# Patient Record
Sex: Male | Born: 1950 | Race: White | Hispanic: No | Marital: Single | Smoking: Current every day smoker
Health system: Southern US, Community
[De-identification: ages and names within clinical notes are randomized; demographics above are authoritative.]

## PROBLEM LIST (undated history)

## (undated) DIAGNOSIS — E079 Disorder of thyroid, unspecified: Secondary | ICD-10-CM

---

## 2014-04-28 DIAGNOSIS — R911 Solitary pulmonary nodule: Secondary | ICD-10-CM | POA: Diagnosis not present

## 2014-04-28 DIAGNOSIS — R51 Headache: Secondary | ICD-10-CM | POA: Diagnosis not present

## 2014-04-28 DIAGNOSIS — F17210 Nicotine dependence, cigarettes, uncomplicated: Secondary | ICD-10-CM | POA: Diagnosis not present

## 2014-04-28 DIAGNOSIS — E039 Hypothyroidism, unspecified: Secondary | ICD-10-CM | POA: Diagnosis not present

## 2014-04-28 DIAGNOSIS — I10 Essential (primary) hypertension: Secondary | ICD-10-CM | POA: Diagnosis not present

## 2014-04-28 DIAGNOSIS — E780 Pure hypercholesterolemia: Secondary | ICD-10-CM | POA: Diagnosis not present

## 2014-04-28 DIAGNOSIS — J209 Acute bronchitis, unspecified: Secondary | ICD-10-CM | POA: Diagnosis not present

## 2014-04-29 DIAGNOSIS — R911 Solitary pulmonary nodule: Secondary | ICD-10-CM | POA: Diagnosis not present

## 2014-07-15 DIAGNOSIS — S51801A Unspecified open wound of right forearm, initial encounter: Secondary | ICD-10-CM | POA: Diagnosis not present

## 2014-07-19 DIAGNOSIS — L0390 Cellulitis, unspecified: Secondary | ICD-10-CM | POA: Diagnosis not present

## 2014-07-19 DIAGNOSIS — S51801D Unspecified open wound of right forearm, subsequent encounter: Secondary | ICD-10-CM | POA: Diagnosis not present

## 2014-07-20 DIAGNOSIS — B3749 Other urogenital candidiasis: Secondary | ICD-10-CM | POA: Diagnosis not present

## 2014-07-20 DIAGNOSIS — R369 Urethral discharge, unspecified: Secondary | ICD-10-CM | POA: Diagnosis not present

## 2014-07-20 DIAGNOSIS — B379 Candidiasis, unspecified: Secondary | ICD-10-CM | POA: Diagnosis not present

## 2014-07-24 DIAGNOSIS — J449 Chronic obstructive pulmonary disease, unspecified: Secondary | ICD-10-CM | POA: Diagnosis not present

## 2014-07-24 DIAGNOSIS — S30862A Insect bite (nonvenomous) of penis, initial encounter: Secondary | ICD-10-CM | POA: Diagnosis not present

## 2014-07-24 DIAGNOSIS — E780 Pure hypercholesterolemia: Secondary | ICD-10-CM | POA: Diagnosis not present

## 2014-07-24 DIAGNOSIS — B009 Herpesviral infection, unspecified: Secondary | ICD-10-CM | POA: Diagnosis not present

## 2014-07-24 DIAGNOSIS — R609 Edema, unspecified: Secondary | ICD-10-CM | POA: Diagnosis not present

## 2014-07-24 DIAGNOSIS — Z792 Long term (current) use of antibiotics: Secondary | ICD-10-CM | POA: Diagnosis not present

## 2014-07-24 DIAGNOSIS — F17210 Nicotine dependence, cigarettes, uncomplicated: Secondary | ICD-10-CM | POA: Diagnosis not present

## 2014-07-24 DIAGNOSIS — E039 Hypothyroidism, unspecified: Secondary | ICD-10-CM | POA: Diagnosis not present

## 2014-07-24 DIAGNOSIS — I10 Essential (primary) hypertension: Secondary | ICD-10-CM | POA: Diagnosis not present

## 2014-09-07 DIAGNOSIS — R45851 Suicidal ideations: Secondary | ICD-10-CM | POA: Diagnosis not present

## 2014-09-07 DIAGNOSIS — R55 Syncope and collapse: Secondary | ICD-10-CM | POA: Diagnosis not present

## 2014-09-07 DIAGNOSIS — R45850 Homicidal ideations: Secondary | ICD-10-CM | POA: Diagnosis not present

## 2014-09-07 DIAGNOSIS — F17210 Nicotine dependence, cigarettes, uncomplicated: Secondary | ICD-10-CM | POA: Diagnosis not present

## 2014-09-07 DIAGNOSIS — Z915 Personal history of self-harm: Secondary | ICD-10-CM | POA: Diagnosis not present

## 2014-09-07 DIAGNOSIS — F329 Major depressive disorder, single episode, unspecified: Secondary | ICD-10-CM | POA: Diagnosis not present

## 2014-09-07 DIAGNOSIS — R4182 Altered mental status, unspecified: Secondary | ICD-10-CM | POA: Diagnosis not present

## 2014-09-07 DIAGNOSIS — I10 Essential (primary) hypertension: Secondary | ICD-10-CM | POA: Diagnosis not present

## 2014-09-08 DIAGNOSIS — R55 Syncope and collapse: Secondary | ICD-10-CM | POA: Diagnosis not present

## 2014-09-08 DIAGNOSIS — R4182 Altered mental status, unspecified: Secondary | ICD-10-CM | POA: Diagnosis not present

## 2014-11-01 DIAGNOSIS — F17210 Nicotine dependence, cigarettes, uncomplicated: Secondary | ICD-10-CM | POA: Diagnosis not present

## 2014-11-01 DIAGNOSIS — R531 Weakness: Secondary | ICD-10-CM | POA: Diagnosis not present

## 2014-11-01 DIAGNOSIS — Z7952 Long term (current) use of systemic steroids: Secondary | ICD-10-CM | POA: Diagnosis not present

## 2014-11-01 DIAGNOSIS — J441 Chronic obstructive pulmonary disease with (acute) exacerbation: Secondary | ICD-10-CM | POA: Diagnosis not present

## 2014-11-01 DIAGNOSIS — R0600 Dyspnea, unspecified: Secondary | ICD-10-CM | POA: Diagnosis not present

## 2014-11-01 DIAGNOSIS — J45901 Unspecified asthma with (acute) exacerbation: Secondary | ICD-10-CM | POA: Diagnosis not present

## 2014-12-06 DIAGNOSIS — J441 Chronic obstructive pulmonary disease with (acute) exacerbation: Secondary | ICD-10-CM | POA: Diagnosis not present

## 2014-12-06 DIAGNOSIS — J209 Acute bronchitis, unspecified: Secondary | ICD-10-CM | POA: Diagnosis not present

## 2014-12-06 DIAGNOSIS — R079 Chest pain, unspecified: Secondary | ICD-10-CM | POA: Diagnosis not present

## 2014-12-16 DIAGNOSIS — J440 Chronic obstructive pulmonary disease with acute lower respiratory infection: Secondary | ICD-10-CM | POA: Diagnosis not present

## 2014-12-16 DIAGNOSIS — Z720 Tobacco use: Secondary | ICD-10-CM | POA: Diagnosis not present

## 2015-01-11 DIAGNOSIS — R531 Weakness: Secondary | ICD-10-CM | POA: Diagnosis not present

## 2015-01-11 DIAGNOSIS — R404 Transient alteration of awareness: Secondary | ICD-10-CM | POA: Diagnosis not present

## 2015-04-26 DIAGNOSIS — R05 Cough: Secondary | ICD-10-CM | POA: Diagnosis not present

## 2015-04-26 DIAGNOSIS — S2232XA Fracture of one rib, left side, initial encounter for closed fracture: Secondary | ICD-10-CM | POA: Diagnosis not present

## 2015-04-26 DIAGNOSIS — R531 Weakness: Secondary | ICD-10-CM | POA: Diagnosis not present

## 2015-11-18 DIAGNOSIS — K5900 Constipation, unspecified: Secondary | ICD-10-CM | POA: Diagnosis not present

## 2015-11-18 DIAGNOSIS — R1084 Generalized abdominal pain: Secondary | ICD-10-CM | POA: Diagnosis not present

## 2015-11-18 DIAGNOSIS — R188 Other ascites: Secondary | ICD-10-CM | POA: Diagnosis not present

## 2015-11-18 DIAGNOSIS — N390 Urinary tract infection, site not specified: Secondary | ICD-10-CM | POA: Diagnosis not present

## 2015-11-18 DIAGNOSIS — R319 Hematuria, unspecified: Secondary | ICD-10-CM | POA: Diagnosis not present

## 2015-11-18 DIAGNOSIS — K7031 Alcoholic cirrhosis of liver with ascites: Secondary | ICD-10-CM | POA: Diagnosis not present

## 2015-11-20 DIAGNOSIS — R109 Unspecified abdominal pain: Secondary | ICD-10-CM | POA: Diagnosis not present

## 2016-10-02 DIAGNOSIS — J441 Chronic obstructive pulmonary disease with (acute) exacerbation: Secondary | ICD-10-CM | POA: Diagnosis not present

## 2016-10-02 DIAGNOSIS — R109 Unspecified abdominal pain: Secondary | ICD-10-CM | POA: Diagnosis not present

## 2016-10-02 DIAGNOSIS — J9602 Acute respiratory failure with hypercapnia: Secondary | ICD-10-CM | POA: Diagnosis not present

## 2016-10-02 DIAGNOSIS — R0902 Hypoxemia: Secondary | ICD-10-CM | POA: Diagnosis not present

## 2016-10-02 DIAGNOSIS — R0602 Shortness of breath: Secondary | ICD-10-CM | POA: Diagnosis not present

## 2016-10-02 DIAGNOSIS — R531 Weakness: Secondary | ICD-10-CM | POA: Diagnosis not present

## 2016-10-02 DIAGNOSIS — R079 Chest pain, unspecified: Secondary | ICD-10-CM | POA: Diagnosis not present

## 2016-10-03 DIAGNOSIS — F1010 Alcohol abuse, uncomplicated: Secondary | ICD-10-CM

## 2016-10-03 DIAGNOSIS — I517 Cardiomegaly: Secondary | ICD-10-CM | POA: Diagnosis not present

## 2016-10-03 DIAGNOSIS — R531 Weakness: Secondary | ICD-10-CM

## 2016-10-03 DIAGNOSIS — J9601 Acute respiratory failure with hypoxia: Secondary | ICD-10-CM

## 2016-10-03 DIAGNOSIS — E669 Obesity, unspecified: Secondary | ICD-10-CM

## 2016-10-03 DIAGNOSIS — J441 Chronic obstructive pulmonary disease with (acute) exacerbation: Secondary | ICD-10-CM

## 2016-10-03 DIAGNOSIS — F329 Major depressive disorder, single episode, unspecified: Secondary | ICD-10-CM | POA: Diagnosis not present

## 2016-10-03 DIAGNOSIS — Z9119 Patient's noncompliance with other medical treatment and regimen: Secondary | ICD-10-CM

## 2016-10-03 DIAGNOSIS — I10 Essential (primary) hypertension: Secondary | ICD-10-CM | POA: Diagnosis not present

## 2016-10-04 DIAGNOSIS — J9601 Acute respiratory failure with hypoxia: Secondary | ICD-10-CM | POA: Diagnosis not present

## 2016-10-04 DIAGNOSIS — K920 Hematemesis: Secondary | ICD-10-CM | POA: Diagnosis not present

## 2016-10-04 DIAGNOSIS — I10 Essential (primary) hypertension: Secondary | ICD-10-CM | POA: Diagnosis not present

## 2016-10-04 DIAGNOSIS — E669 Obesity, unspecified: Secondary | ICD-10-CM | POA: Diagnosis not present

## 2016-10-04 DIAGNOSIS — Z9119 Patient's noncompliance with other medical treatment and regimen: Secondary | ICD-10-CM | POA: Diagnosis not present

## 2016-10-04 DIAGNOSIS — J441 Chronic obstructive pulmonary disease with (acute) exacerbation: Secondary | ICD-10-CM | POA: Diagnosis not present

## 2016-10-04 DIAGNOSIS — F329 Major depressive disorder, single episode, unspecified: Secondary | ICD-10-CM | POA: Diagnosis not present

## 2016-10-04 DIAGNOSIS — F1010 Alcohol abuse, uncomplicated: Secondary | ICD-10-CM | POA: Diagnosis not present

## 2016-10-04 DIAGNOSIS — R531 Weakness: Secondary | ICD-10-CM | POA: Diagnosis not present

## 2016-10-05 DIAGNOSIS — K7460 Unspecified cirrhosis of liver: Secondary | ICD-10-CM | POA: Diagnosis not present

## 2016-10-05 DIAGNOSIS — F1010 Alcohol abuse, uncomplicated: Secondary | ICD-10-CM | POA: Diagnosis not present

## 2016-10-05 DIAGNOSIS — R531 Weakness: Secondary | ICD-10-CM | POA: Diagnosis not present

## 2016-10-05 DIAGNOSIS — E669 Obesity, unspecified: Secondary | ICD-10-CM | POA: Diagnosis not present

## 2016-10-05 DIAGNOSIS — F329 Major depressive disorder, single episode, unspecified: Secondary | ICD-10-CM | POA: Diagnosis not present

## 2016-10-05 DIAGNOSIS — I10 Essential (primary) hypertension: Secondary | ICD-10-CM | POA: Diagnosis not present

## 2016-10-05 DIAGNOSIS — J441 Chronic obstructive pulmonary disease with (acute) exacerbation: Secondary | ICD-10-CM | POA: Diagnosis not present

## 2016-10-05 DIAGNOSIS — Z9119 Patient's noncompliance with other medical treatment and regimen: Secondary | ICD-10-CM | POA: Diagnosis not present

## 2016-10-05 DIAGNOSIS — J9601 Acute respiratory failure with hypoxia: Secondary | ICD-10-CM | POA: Diagnosis not present

## 2016-10-05 DIAGNOSIS — K922 Gastrointestinal hemorrhage, unspecified: Secondary | ICD-10-CM | POA: Diagnosis not present

## 2016-10-05 DIAGNOSIS — K920 Hematemesis: Secondary | ICD-10-CM | POA: Diagnosis not present

## 2016-10-06 DIAGNOSIS — Z9119 Patient's noncompliance with other medical treatment and regimen: Secondary | ICD-10-CM | POA: Diagnosis not present

## 2016-10-06 DIAGNOSIS — F1010 Alcohol abuse, uncomplicated: Secondary | ICD-10-CM | POA: Diagnosis not present

## 2016-10-06 DIAGNOSIS — E669 Obesity, unspecified: Secondary | ICD-10-CM | POA: Diagnosis not present

## 2016-10-06 DIAGNOSIS — J9601 Acute respiratory failure with hypoxia: Secondary | ICD-10-CM | POA: Diagnosis not present

## 2016-10-06 DIAGNOSIS — J441 Chronic obstructive pulmonary disease with (acute) exacerbation: Secondary | ICD-10-CM | POA: Diagnosis not present

## 2016-10-06 DIAGNOSIS — F329 Major depressive disorder, single episode, unspecified: Secondary | ICD-10-CM | POA: Diagnosis not present

## 2016-10-06 DIAGNOSIS — I10 Essential (primary) hypertension: Secondary | ICD-10-CM | POA: Diagnosis not present

## 2016-10-06 DIAGNOSIS — R531 Weakness: Secondary | ICD-10-CM | POA: Diagnosis not present

## 2016-10-06 DIAGNOSIS — K920 Hematemesis: Secondary | ICD-10-CM | POA: Diagnosis not present

## 2016-10-07 DIAGNOSIS — J441 Chronic obstructive pulmonary disease with (acute) exacerbation: Secondary | ICD-10-CM | POA: Diagnosis not present

## 2016-10-07 DIAGNOSIS — J9601 Acute respiratory failure with hypoxia: Secondary | ICD-10-CM | POA: Diagnosis not present

## 2016-10-07 DIAGNOSIS — I10 Essential (primary) hypertension: Secondary | ICD-10-CM | POA: Diagnosis not present

## 2016-10-07 DIAGNOSIS — K920 Hematemesis: Secondary | ICD-10-CM | POA: Diagnosis not present

## 2016-10-07 DIAGNOSIS — R531 Weakness: Secondary | ICD-10-CM | POA: Diagnosis not present

## 2016-10-07 DIAGNOSIS — F1010 Alcohol abuse, uncomplicated: Secondary | ICD-10-CM | POA: Diagnosis not present

## 2016-10-07 DIAGNOSIS — F329 Major depressive disorder, single episode, unspecified: Secondary | ICD-10-CM | POA: Diagnosis not present

## 2016-10-07 DIAGNOSIS — Z9119 Patient's noncompliance with other medical treatment and regimen: Secondary | ICD-10-CM | POA: Diagnosis not present

## 2016-10-07 DIAGNOSIS — E669 Obesity, unspecified: Secondary | ICD-10-CM | POA: Diagnosis not present

## 2016-10-08 DIAGNOSIS — I10 Essential (primary) hypertension: Secondary | ICD-10-CM | POA: Diagnosis not present

## 2016-10-08 DIAGNOSIS — F329 Major depressive disorder, single episode, unspecified: Secondary | ICD-10-CM | POA: Diagnosis not present

## 2016-10-08 DIAGNOSIS — F1010 Alcohol abuse, uncomplicated: Secondary | ICD-10-CM | POA: Diagnosis not present

## 2016-10-08 DIAGNOSIS — J441 Chronic obstructive pulmonary disease with (acute) exacerbation: Secondary | ICD-10-CM | POA: Diagnosis not present

## 2016-10-08 DIAGNOSIS — E669 Obesity, unspecified: Secondary | ICD-10-CM | POA: Diagnosis not present

## 2016-10-08 DIAGNOSIS — J9601 Acute respiratory failure with hypoxia: Secondary | ICD-10-CM | POA: Diagnosis not present

## 2016-10-08 DIAGNOSIS — R531 Weakness: Secondary | ICD-10-CM | POA: Diagnosis not present

## 2016-10-08 DIAGNOSIS — K920 Hematemesis: Secondary | ICD-10-CM | POA: Diagnosis not present

## 2016-10-08 DIAGNOSIS — Z9119 Patient's noncompliance with other medical treatment and regimen: Secondary | ICD-10-CM | POA: Diagnosis not present

## 2016-10-11 DIAGNOSIS — K7030 Alcoholic cirrhosis of liver without ascites: Secondary | ICD-10-CM | POA: Diagnosis not present

## 2016-10-11 DIAGNOSIS — E669 Obesity, unspecified: Secondary | ICD-10-CM | POA: Diagnosis not present

## 2016-10-11 DIAGNOSIS — Z09 Encounter for follow-up examination after completed treatment for conditions other than malignant neoplasm: Secondary | ICD-10-CM | POA: Diagnosis not present

## 2016-10-11 DIAGNOSIS — J441 Chronic obstructive pulmonary disease with (acute) exacerbation: Secondary | ICD-10-CM | POA: Diagnosis not present

## 2016-10-11 DIAGNOSIS — Z9181 History of falling: Secondary | ICD-10-CM | POA: Diagnosis not present

## 2016-10-11 DIAGNOSIS — E039 Hypothyroidism, unspecified: Secondary | ICD-10-CM | POA: Diagnosis not present

## 2016-10-11 DIAGNOSIS — Z6832 Body mass index (BMI) 32.0-32.9, adult: Secondary | ICD-10-CM | POA: Diagnosis not present

## 2016-10-11 DIAGNOSIS — R600 Localized edema: Secondary | ICD-10-CM | POA: Diagnosis not present

## 2016-10-11 DIAGNOSIS — F329 Major depressive disorder, single episode, unspecified: Secondary | ICD-10-CM | POA: Diagnosis not present

## 2016-10-11 DIAGNOSIS — Z716 Tobacco abuse counseling: Secondary | ICD-10-CM | POA: Diagnosis not present

## 2016-11-11 DIAGNOSIS — R404 Transient alteration of awareness: Secondary | ICD-10-CM | POA: Diagnosis not present

## 2016-11-11 DIAGNOSIS — Z452 Encounter for adjustment and management of vascular access device: Secondary | ICD-10-CM | POA: Diagnosis not present

## 2016-11-11 DIAGNOSIS — I110 Hypertensive heart disease with heart failure: Secondary | ICD-10-CM | POA: Diagnosis not present

## 2016-11-11 DIAGNOSIS — E039 Hypothyroidism, unspecified: Secondary | ICD-10-CM | POA: Diagnosis not present

## 2016-11-11 DIAGNOSIS — F17210 Nicotine dependence, cigarettes, uncomplicated: Secondary | ICD-10-CM | POA: Diagnosis not present

## 2016-11-11 DIAGNOSIS — E7800 Pure hypercholesterolemia, unspecified: Secondary | ICD-10-CM | POA: Diagnosis not present

## 2016-11-11 DIAGNOSIS — K922 Gastrointestinal hemorrhage, unspecified: Secondary | ICD-10-CM | POA: Diagnosis not present

## 2016-11-11 DIAGNOSIS — R42 Dizziness and giddiness: Secondary | ICD-10-CM | POA: Diagnosis not present

## 2016-11-11 DIAGNOSIS — R7989 Other specified abnormal findings of blood chemistry: Secondary | ICD-10-CM | POA: Diagnosis not present

## 2016-11-11 DIAGNOSIS — I509 Heart failure, unspecified: Secondary | ICD-10-CM | POA: Diagnosis not present

## 2016-11-11 DIAGNOSIS — Z79899 Other long term (current) drug therapy: Secondary | ICD-10-CM | POA: Diagnosis not present

## 2016-11-11 DIAGNOSIS — J449 Chronic obstructive pulmonary disease, unspecified: Secondary | ICD-10-CM | POA: Diagnosis not present

## 2016-11-11 DIAGNOSIS — R0602 Shortness of breath: Secondary | ICD-10-CM | POA: Diagnosis not present

## 2016-11-11 DIAGNOSIS — R079 Chest pain, unspecified: Secondary | ICD-10-CM | POA: Diagnosis not present

## 2016-11-11 DIAGNOSIS — K920 Hematemesis: Secondary | ICD-10-CM | POA: Diagnosis not present

## 2016-11-11 DIAGNOSIS — R109 Unspecified abdominal pain: Secondary | ICD-10-CM | POA: Diagnosis not present

## 2016-11-12 DIAGNOSIS — I8500 Esophageal varices without bleeding: Secondary | ICD-10-CM | POA: Diagnosis not present

## 2016-11-12 DIAGNOSIS — E039 Hypothyroidism, unspecified: Secondary | ICD-10-CM | POA: Diagnosis not present

## 2016-11-12 DIAGNOSIS — K921 Melena: Secondary | ICD-10-CM | POA: Diagnosis not present

## 2016-11-12 DIAGNOSIS — I351 Nonrheumatic aortic (valve) insufficiency: Secondary | ICD-10-CM | POA: Diagnosis not present

## 2016-11-12 DIAGNOSIS — Z8719 Personal history of other diseases of the digestive system: Secondary | ICD-10-CM | POA: Diagnosis not present

## 2016-11-12 DIAGNOSIS — K7030 Alcoholic cirrhosis of liver without ascites: Secondary | ICD-10-CM | POA: Diagnosis not present

## 2016-11-12 DIAGNOSIS — I10 Essential (primary) hypertension: Secondary | ICD-10-CM | POA: Diagnosis not present

## 2016-11-12 DIAGNOSIS — K7290 Hepatic failure, unspecified without coma: Secondary | ICD-10-CM | POA: Diagnosis not present

## 2016-11-12 DIAGNOSIS — K920 Hematemesis: Secondary | ICD-10-CM | POA: Diagnosis not present

## 2016-11-12 DIAGNOSIS — F329 Major depressive disorder, single episode, unspecified: Secondary | ICD-10-CM | POA: Diagnosis not present

## 2016-11-12 DIAGNOSIS — I517 Cardiomegaly: Secondary | ICD-10-CM | POA: Diagnosis not present

## 2016-11-12 DIAGNOSIS — E7220 Disorder of urea cycle metabolism, unspecified: Secondary | ICD-10-CM | POA: Diagnosis not present

## 2016-11-12 DIAGNOSIS — J449 Chronic obstructive pulmonary disease, unspecified: Secondary | ICD-10-CM | POA: Diagnosis not present

## 2016-11-12 DIAGNOSIS — E876 Hypokalemia: Secondary | ICD-10-CM | POA: Diagnosis not present

## 2016-11-12 DIAGNOSIS — D62 Acute posthemorrhagic anemia: Secondary | ICD-10-CM | POA: Diagnosis not present

## 2016-11-13 DIAGNOSIS — I499 Cardiac arrhythmia, unspecified: Secondary | ICD-10-CM | POA: Diagnosis not present

## 2016-11-13 DIAGNOSIS — R7989 Other specified abnormal findings of blood chemistry: Secondary | ICD-10-CM | POA: Diagnosis not present

## 2016-11-13 DIAGNOSIS — K920 Hematemesis: Secondary | ICD-10-CM | POA: Diagnosis not present

## 2016-11-13 DIAGNOSIS — F329 Major depressive disorder, single episode, unspecified: Secondary | ICD-10-CM | POA: Diagnosis not present

## 2016-11-13 DIAGNOSIS — R1011 Right upper quadrant pain: Secondary | ICD-10-CM | POA: Diagnosis not present

## 2016-11-13 DIAGNOSIS — D62 Acute posthemorrhagic anemia: Secondary | ICD-10-CM | POA: Diagnosis not present

## 2016-11-13 DIAGNOSIS — K921 Melena: Secondary | ICD-10-CM | POA: Diagnosis not present

## 2016-11-13 DIAGNOSIS — E7220 Disorder of urea cycle metabolism, unspecified: Secondary | ICD-10-CM | POA: Diagnosis not present

## 2016-11-13 DIAGNOSIS — I8500 Esophageal varices without bleeding: Secondary | ICD-10-CM | POA: Diagnosis not present

## 2016-11-13 DIAGNOSIS — I10 Essential (primary) hypertension: Secondary | ICD-10-CM | POA: Diagnosis not present

## 2016-11-13 DIAGNOSIS — J449 Chronic obstructive pulmonary disease, unspecified: Secondary | ICD-10-CM | POA: Diagnosis not present

## 2016-11-13 DIAGNOSIS — E876 Hypokalemia: Secondary | ICD-10-CM | POA: Diagnosis not present

## 2016-11-13 DIAGNOSIS — K7290 Hepatic failure, unspecified without coma: Secondary | ICD-10-CM | POA: Diagnosis not present

## 2016-11-13 DIAGNOSIS — R109 Unspecified abdominal pain: Secondary | ICD-10-CM | POA: Diagnosis not present

## 2016-11-13 DIAGNOSIS — K7030 Alcoholic cirrhosis of liver without ascites: Secondary | ICD-10-CM | POA: Diagnosis not present

## 2016-11-14 DIAGNOSIS — F329 Major depressive disorder, single episode, unspecified: Secondary | ICD-10-CM | POA: Diagnosis not present

## 2016-11-14 DIAGNOSIS — R109 Unspecified abdominal pain: Secondary | ICD-10-CM | POA: Diagnosis not present

## 2016-11-14 DIAGNOSIS — I10 Essential (primary) hypertension: Secondary | ICD-10-CM | POA: Diagnosis not present

## 2016-11-14 DIAGNOSIS — D509 Iron deficiency anemia, unspecified: Secondary | ICD-10-CM | POA: Diagnosis not present

## 2016-11-14 DIAGNOSIS — K920 Hematemesis: Secondary | ICD-10-CM | POA: Diagnosis not present

## 2016-11-14 DIAGNOSIS — K7031 Alcoholic cirrhosis of liver with ascites: Secondary | ICD-10-CM | POA: Diagnosis not present

## 2016-11-14 DIAGNOSIS — J449 Chronic obstructive pulmonary disease, unspecified: Secondary | ICD-10-CM | POA: Diagnosis not present

## 2016-11-14 DIAGNOSIS — I8511 Secondary esophageal varices with bleeding: Secondary | ICD-10-CM | POA: Diagnosis not present

## 2016-11-14 DIAGNOSIS — E876 Hypokalemia: Secondary | ICD-10-CM | POA: Diagnosis not present

## 2016-11-14 DIAGNOSIS — D62 Acute posthemorrhagic anemia: Secondary | ICD-10-CM | POA: Diagnosis not present

## 2016-11-14 DIAGNOSIS — K7030 Alcoholic cirrhosis of liver without ascites: Secondary | ICD-10-CM | POA: Diagnosis not present

## 2016-11-14 DIAGNOSIS — E7220 Disorder of urea cycle metabolism, unspecified: Secondary | ICD-10-CM | POA: Diagnosis not present

## 2016-11-14 DIAGNOSIS — R7989 Other specified abnormal findings of blood chemistry: Secondary | ICD-10-CM | POA: Diagnosis not present

## 2016-11-14 DIAGNOSIS — K7290 Hepatic failure, unspecified without coma: Secondary | ICD-10-CM | POA: Diagnosis not present

## 2016-11-15 DIAGNOSIS — Z8719 Personal history of other diseases of the digestive system: Secondary | ICD-10-CM | POA: Diagnosis not present

## 2016-11-15 DIAGNOSIS — K7030 Alcoholic cirrhosis of liver without ascites: Secondary | ICD-10-CM | POA: Diagnosis not present

## 2016-11-15 DIAGNOSIS — K921 Melena: Secondary | ICD-10-CM | POA: Diagnosis not present

## 2016-11-15 DIAGNOSIS — R109 Unspecified abdominal pain: Secondary | ICD-10-CM | POA: Diagnosis not present

## 2016-11-15 DIAGNOSIS — I8511 Secondary esophageal varices with bleeding: Secondary | ICD-10-CM | POA: Diagnosis not present

## 2016-11-15 DIAGNOSIS — K920 Hematemesis: Secondary | ICD-10-CM | POA: Diagnosis not present

## 2016-11-17 DIAGNOSIS — Z950 Presence of cardiac pacemaker: Secondary | ICD-10-CM | POA: Diagnosis not present

## 2017-07-27 ENCOUNTER — Emergency Department (HOSPITAL_COMMUNITY): Payer: Medicare Other

## 2017-07-27 ENCOUNTER — Emergency Department (HOSPITAL_COMMUNITY)
Admission: EM | Admit: 2017-07-27 | Discharge: 2017-07-28 | Disposition: A | Discharge status: Home / Self Care | Payer: Medicare Other | Attending: Emergency Medicine | Admitting: Emergency Medicine

## 2017-07-27 ENCOUNTER — Other Ambulatory Visit: Payer: Self-pay

## 2017-07-27 ENCOUNTER — Encounter (HOSPITAL_COMMUNITY): Payer: Self-pay

## 2017-07-27 DIAGNOSIS — D649 Anemia, unspecified: Secondary | ICD-10-CM | POA: Diagnosis not present

## 2017-07-27 DIAGNOSIS — F17200 Nicotine dependence, unspecified, uncomplicated: Secondary | ICD-10-CM | POA: Insufficient documentation

## 2017-07-27 DIAGNOSIS — M7989 Other specified soft tissue disorders: Secondary | ICD-10-CM

## 2017-07-27 DIAGNOSIS — R05 Cough: Secondary | ICD-10-CM | POA: Insufficient documentation

## 2017-07-27 DIAGNOSIS — M79604 Pain in right leg: Secondary | ICD-10-CM | POA: Diagnosis not present

## 2017-07-27 DIAGNOSIS — D696 Thrombocytopenia, unspecified: Secondary | ICD-10-CM | POA: Diagnosis not present

## 2017-07-27 DIAGNOSIS — R7989 Other specified abnormal findings of blood chemistry: Secondary | ICD-10-CM | POA: Diagnosis not present

## 2017-07-27 DIAGNOSIS — R0602 Shortness of breath: Secondary | ICD-10-CM | POA: Insufficient documentation

## 2017-07-27 DIAGNOSIS — R600 Localized edema: Secondary | ICD-10-CM | POA: Diagnosis not present

## 2017-07-27 DIAGNOSIS — R2243 Localized swelling, mass and lump, lower limb, bilateral: Secondary | ICD-10-CM | POA: Diagnosis present

## 2017-07-27 DIAGNOSIS — R52 Pain, unspecified: Secondary | ICD-10-CM | POA: Diagnosis not present

## 2017-07-27 HISTORY — DX: Disorder of thyroid, unspecified: E07.9

## 2017-07-27 LAB — CBC WITH DIFFERENTIAL/PLATELET
BASOS PCT: 2 %
Basophils Absolute: 0.1 10*3/uL (ref 0.0–0.1)
EOS ABS: 0.2 10*3/uL (ref 0.0–0.7)
EOS PCT: 3 %
HCT: 28 % — ABNORMAL LOW (ref 39.0–52.0)
HEMOGLOBIN: 8.2 g/dL — AB (ref 13.0–17.0)
LYMPHS ABS: 1 10*3/uL (ref 0.7–4.0)
Lymphocytes Relative: 19 %
MCH: 22.2 pg — AB (ref 26.0–34.0)
MCHC: 29.3 g/dL — AB (ref 30.0–36.0)
MCV: 75.7 fL — ABNORMAL LOW (ref 78.0–100.0)
MONO ABS: 0.6 10*3/uL (ref 0.1–1.0)
MONOS PCT: 11 %
NEUTROS PCT: 65 %
Neutro Abs: 3.6 10*3/uL (ref 1.7–7.7)
PLATELETS: 118 10*3/uL — AB (ref 150–400)
RBC: 3.7 MIL/uL — ABNORMAL LOW (ref 4.22–5.81)
RDW: 16.6 % — AB (ref 11.5–15.5)
WBC: 5.5 10*3/uL (ref 4.0–10.5)

## 2017-07-27 LAB — COMPREHENSIVE METABOLIC PANEL
ALT: 24 U/L (ref 17–63)
AST: 41 U/L (ref 15–41)
Albumin: 3.6 g/dL (ref 3.5–5.0)
Alkaline Phosphatase: 51 U/L (ref 38–126)
Anion gap: 11 (ref 5–15)
BUN: 27 mg/dL — AB (ref 6–20)
CALCIUM: 9.7 mg/dL (ref 8.9–10.3)
CO2: 29 mmol/L (ref 22–32)
CREATININE: 1.19 mg/dL (ref 0.61–1.24)
Chloride: 98 mmol/L — ABNORMAL LOW (ref 101–111)
GFR calc non Af Amer: >60 mL/min (ref >60)
Glucose, Bld: 85 mg/dL (ref 65–99)
Potassium: 3.1 mmol/L — ABNORMAL LOW (ref 3.5–5.1)
SODIUM: 138 mmol/L (ref 135–145)
Total Bilirubin: 0.7 mg/dL (ref 0.3–1.2)
Total Protein: 8 g/dL (ref 6.5–8.1)

## 2017-07-27 LAB — I-STAT CG4 LACTIC ACID, ED: Lactic Acid, Venous: 0.94 mmol/L (ref 0.5–1.9)

## 2017-07-27 LAB — POC OCCULT BLOOD, ED: FECAL OCCULT BLD: NEGATIVE

## 2017-07-27 LAB — D-DIMER, QUANTITATIVE (NOT AT ARMC): D DIMER QUANT: 2.36 ug{FEU}/mL — AB (ref 0.00–0.50)

## 2017-07-27 LAB — BRAIN NATRIURETIC PEPTIDE: B Natriuretic Peptide: 428.2 pg/mL — ABNORMAL HIGH (ref 0.0–100.0)

## 2017-07-27 MED ORDER — IOPAMIDOL (ISOVUE-370) INJECTION 76%
100.0000 mL | Freq: Once | INTRAVENOUS | Status: AC | PRN
  Administered 2017-07-27: 100 mL via INTRAVENOUS

## 2017-07-27 MED ORDER — IOPAMIDOL (ISOVUE-370) INJECTION 76%
INTRAVENOUS | Status: AC
  Filled 2017-07-27: qty 100

## 2017-07-27 MED ORDER — POTASSIUM CHLORIDE CRYS ER 20 MEQ PO TBCR
40.0000 meq | EXTENDED_RELEASE_TABLET | Freq: Once | ORAL | Status: AC
  Administered 2017-07-27: 40 meq via ORAL
  Filled 2017-07-27: qty 2

## 2017-07-27 MED ORDER — OXYCODONE-ACETAMINOPHEN 5-325 MG PO TABS
2.0000 | ORAL_TABLET | Freq: Once | ORAL | Status: AC
  Administered 2017-07-28: 2 via ORAL
  Filled 2017-07-27: qty 2

## 2017-07-27 MED ORDER — RANITIDINE HCL 150 MG/10ML PO SYRP
150.0000 mg | ORAL_SOLUTION | Freq: Once | ORAL | Status: AC
  Administered 2017-07-28: 150 mg via ORAL
  Filled 2017-07-27: qty 10

## 2017-07-27 NOTE — ED Notes (Signed)
Patient transported to CT 

## 2017-07-27 NOTE — ED Provider Notes (Signed)
[name]   CSN: [id number] Arrival date & time: 07/27/17  1640     History   Chief Complaint Chief Complaint  Patient presents with  . Leg Swelling    HPI Noah Anderson is a 67 y.o. male.  The history is provided by the patient. No language interpreter was used.   Noah Anderson is a 67 y.o. male who presents to the Emergency Department complaining of leg swelling. Level V caveat due to vague historian.  Presents to the emergency department complaining of chronic leg swelling. He reports leg swelling that is been ongoing for very long time but pain is worsening today. He cannot describe how the pain is changed today. He reports that they feel hot at times. He denies any shortness of breath or chest pain. No nausea, vomiting. He does endorse intermittent black stools. He has a history of alcohol abuse, tobacco abuse, thrombocytopenia. He states that he no longer drinks alcohol. Past Medical History:  Diagnosis Date  . Thyroid disease     There are no active problems to display for this patient.   History reviewed. No pertinent surgical history.      Home Medications    Prior to Admission medications   Not on File    Family History History reviewed. No pertinent family history.  Social History Social History   Tobacco Use  . Smoking status: Current Every Day Smoker  . Smokeless tobacco: Never Used  Substance Use Topics  . Alcohol use: Not Currently  . Drug use: Not on file     Allergies   Ibuprofen and Penicillins   Review of Systems Review of Systems  All other systems reviewed and are negative.    Physical Exam Updated Vital Signs BP 123/68   Pulse 83   Temp 97.8 F (36.6 C) (Oral)   Resp 15   Ht 5\' 8"  (1.727 m)   SpO2 100%   Physical Exam  Constitutional: He is oriented to person, place, and time. He appears well-developed and well-nourished.  HENT:  Head: Normocephalic and  atraumatic.  Cardiovascular: Normal rate and regular rhythm.  No murmur heard. Pulmonary/Chest: Effort normal and breath sounds normal. No respiratory distress.  Abdominal: Soft. There is no tenderness. There is no rebound and no guarding.  Genitourinary:  Genitourinary Comments: Nontender rectal exam, brown stool  Musculoskeletal:  2+ pitting edema to the right lower extremity with mild to moderate erythema and chronic venous stasis changes. One plus pitting edema to the left lower extremity with mild erythema. 2+ DP pulses bilaterally.  Neurological: He is alert and oriented to person, place, and time.  Skin: Skin is warm and dry.  Psychiatric: He has a normal mood and affect. His behavior is normal.  Nursing note and vitals reviewed.    ED Treatments / Results  Labs (all labs ordered are listed, but only abnormal results are displayed) Labs Reviewed  COMPREHENSIVE METABOLIC PANEL - Abnormal; Notable for the following components:      Result Value   Potassium 3.1 (*)    Chloride 98 (*)    BUN 27 (*)    All other components within normal limits  CBC WITH DIFFERENTIAL/PLATELET - Abnormal; Notable for the following components:   RBC 3.70 (*)    Hemoglobin 8.2 (*)    HCT 28.0 (*)    MCV 75.7 (*)    MCH 22.2 (*)    MCHC 29.3 (*)    RDW 16.6 (*)  Platelets 118 (*)    All other components within normal limits  D-DIMER, QUANTITATIVE (NOT AT [facility]) - Abnormal; Notable for the following components:   D-Dimer, Quant 2.36 (*)    All other components within normal limits  BRAIN NATRIURETIC PEPTIDE - Abnormal; Notable for the following components:   B Natriuretic Peptide 428.2 (*)    All other components within normal limits  I-STAT CG4 LACTIC ACID, ED  POC OCCULT BLOOD, ED    EKG None  Radiology Dg Chest 2 View  Result Date: 07/27/2017 CLINICAL DATA:  Leg swelling EXAM: CHEST - 2 VIEW COMPARISON:  11/11/2016 FINDINGS: Dual lead pacer remains in place. Ill-defined density  along the end of the right first rib is chronically present at least back through 2014 and accordingly is likely bony. Old healed bilateral rib fractures. Thoracic spondylosis. The lungs appear otherwise clear. No pleural effusion. No cardiomegaly. Atherosclerotic calcification of the aortic arch. IMPRESSION: 1.  No active cardiopulmonary disease is radiographically apparent. 2.  Aortoiliac atherosclerotic vascular disease. 3. Dual lead pacer noted. Electronically Signed   By: Gaylyn Rong M.D.   On: 07/27/2017 17:29    Procedures Procedures (including critical care time)  Medications Ordered in ED Medications  iopamidol (ISOVUE-370) 76 % injection (has no administration in time range)  potassium chloride SA (K-DUR,KLOR-CON) CR tablet 40 mEq (40 mEq Oral Given 07/27/17 2153)  iopamidol (ISOVUE-370) 76 % injection 100 mL (100 mLs Intravenous Contrast Given 07/27/17 2216)     Initial Impression / Assessment and Plan / ED Course  I have reviewed the triage vital signs and the nursing notes.  Pertinent labs & imaging results that were available during my care of the patient were reviewed by me and considered in my medical decision making (see chart for details).     Patient with history of cardiac disease here for evaluation of chronic bilateral lower extremity edema and pain. He is unable to clarify how long the pain and swelling have been present. He does have asymmetric edema on examination with increased erythema and tenderness to the right lower extremity. He has experienced episodes of fleeting chest pain and shortness of breath. Plan to obtain CTA to rule out PE. If CTA is negative plan to discharge home with ultrasound in the morning to evaluate for DVT. Limited access to outside records demonstrate a stable BNP from several years ago. Thrombocytopenia is also stable. Anemia is worse than three years prior but no reports of active bleeding at this point.  Patient care transferred pending  further imaging.   Final Clinical Impressions(s) / ED Diagnoses   Final diagnoses:  None    ED Discharge Orders    None       Tilden Fossa, MD 07/27/17 2234

## 2017-07-27 NOTE — ED Triage Notes (Addendum)
[facility]- pt coming from home with complaint of bilateral leg swelling for "a while." Redness and swelling noted. Pt denies hx of CHF.

## 2017-07-27 NOTE — ED Triage Notes (Addendum)
Pt reports leg swelling, denies SOB but does report cough. Legs red and hot to touch.

## 2017-07-28 ENCOUNTER — Emergency Department (HOSPITAL_BASED_OUTPATIENT_CLINIC_OR_DEPARTMENT_OTHER)
Admit: 2017-07-28 | Discharge: 2017-07-28 | Disposition: A | Discharge status: Home / Self Care | Payer: Medicare Other | Attending: Emergency Medicine | Admitting: Emergency Medicine

## 2017-07-28 DIAGNOSIS — M7989 Other specified soft tissue disorders: Secondary | ICD-10-CM | POA: Diagnosis not present

## 2017-07-28 MED ORDER — DOXYCYCLINE HYCLATE 100 MG PO TABS
100.0000 mg | ORAL_TABLET | Freq: Once | ORAL | Status: AC
  Administered 2017-07-28: 100 mg via ORAL
  Filled 2017-07-28: qty 1

## 2017-07-28 MED ORDER — DOXYCYCLINE HYCLATE 100 MG PO CAPS: 100.0000 mg | ORAL_CAPSULE | Freq: Two times a day (BID) | ORAL | 0 refills | Status: AC

## 2017-07-28 NOTE — ED Notes (Signed)
Assisted pt to restroom  

## 2017-07-28 NOTE — ED Notes (Signed)
Assisted pt to restroom again. Does not want to use urinal.

## 2017-07-28 NOTE — ED Notes (Signed)
Pt returned from Vascular. No DVT was found per tech.

## 2017-07-28 NOTE — ED Notes (Signed)
Pt eating his breakfast.

## 2017-07-28 NOTE — ED Notes (Signed)
Pt to US.

## 2017-07-28 NOTE — ED Notes (Signed)
Breakfast tray ordered 

## 2017-07-28 NOTE — ED Notes (Signed)
Assisted pt to restroom via WC. States that usually every time he urinates he has a BM also. States that he just doesn't want to "mess up his brief and have me to clean him". Pt pleasant. C/o increasing pain to legs.

## 2017-07-28 NOTE — ED Notes (Signed)
Pt given sandwich, drink, apple sauce and crackers with PB. Assisted pt to restroom. Urinal at bedside. Will continue to monitor.

## 2017-07-28 NOTE — ED Notes (Signed)
Assisted pt to restroom. Insists on going to restroom and not using urinal. WC used. Pt is unsteady on feet.

## 2017-07-28 NOTE — Discharge Instructions (Signed)
Please take all of your antibiotics until finished!   You may develop abdominal discomfort or diarrhea from the antibiotic.  You may help offset this with probiotics which you can buy or get in yogurt. Do not eat  or take the probiotics until 2 hours after your antibiotic.   Follow-up with a primary care physician for reevaluation of your symptoms.  Return to the emergency department if any concerning signs or symptoms develop.

## 2017-07-28 NOTE — ED Notes (Signed)
Assisted pt to restroom for the second time. Pt became dizzy and lightheaded. Possibly due to the oxycodone. [facility] and helped to restroom and then back to bed. States that the pain in his legs has improved. Will continue to monitor.

## 2017-07-28 NOTE — ED Notes (Signed)
Pt c/o bilat legs itching and requesting to remove gauze and ace bandages. Gauze and ace removed. Swelling noted to both lower legs with redness. Pt requests a cool wash cloth to both legs.

## 2017-07-28 NOTE — ED Provider Notes (Addendum)
6:04 AM Noah Anderson care assumed from Dr. Madilyn Hookees at shift change.  Noah Anderson a generally poor historian with primary complaints of leg swelling.  He did have an elevated d-dimer today with history of prior blood clot.  CTA pending at change of shift which has been reviewed and is negative.  Plan discussed with Dr. Madilyn Hookees which included discharge with return in the morning for duplex ultrasound.  Noah Anderson, however, reports that he had no transportation home and would refuse to come back for additional imaging if he were discharged.  Decision was made to board the Noah Anderson in the emergency department pending venous duplex in the morning.  Additional imaging has been ordered.  Noah Anderson has been complaining of chronic generalized body pain.  He notes this is worse in his lower extremities.  This was managed with Percocet.  He denies use of pain medications on an outpatient basis.  Noah Anderson [facility] Fawze[facility] PA-C at shift change pending Venous Duplex.  If positive, will likely require initiation of NOAC despite chronic anemia and thrombocytopenia 2/2 hx of ETOH abuse. If negative, will discharge with doxycycline for cellulitis coverage. Regardless of imaging findings, I believe the Noah Anderson is appropriate for discharge and OP primary care follow up.   Vitals:   07/27/17 2115 07/27/17 2200 07/27/17 2230 07/27/17 2245  BP:  123/68 122/81 (!) 118/57  Pulse: 88 83 82 83  Resp: 16 15 17 16   Temp:      TempSrc:      SpO2: 95% 100% 98% 100%  Height:       Results for orders placed or performed during the hospital encounter of 07/27/17  Comprehensive metabolic panel  Result Value Ref Range   Sodium 138 135 - 145 mmol/L   Potassium 3.1 (L) 3.5 - 5.1 mmol/L   Chloride 98 (L) 101 - 111 mmol/L   CO2 29 22 - 32 mmol/L   Glucose, Bld 85 65 - 99 mg/dL   BUN 27 (H) 6 - 20 mg/dL   Creatinine, Ser 8.291.19 0.61 - 1.24 mg/dL   Calcium 9.7 8.9 - 56.210.3 mg/dL   Total Protein 8.0 6.5 - 8.1 g/dL   Albumin 3.6 3.5 - 5.0 g/dL    AST 41 15 - 41 U/L   ALT 24 17 - 63 U/L   Alkaline Phosphatase 51 38 - 126 U/L   Total Bilirubin 0.7 0.3 - 1.2 mg/dL   GFR calc non Af Amer >60 >60 mL/min   GFR calc Af Amer >60 >60 mL/min   Anion gap 11 5 - 15  CBC with Differential  Result Value Ref Range   WBC 5.5 4.0 - 10.5 K/uL   RBC 3.70 (L) 4.22 - 5.81 MIL/uL   Hemoglobin 8.2 (L) 13.0 - 17.0 g/dL   HCT 13.028.0 (L) 86.539.0 - 78.452.0 %   MCV 75.7 (L) 78.0 - 100.0 fL   MCH 22.2 (L) 26.0 - 34.0 pg   MCHC 29.3 (L) 30.0 - 36.0 g/dL   RDW 69.616.6 (H) 29.511.5 - 28.415.5 %   Platelets 118 (L) 150 - 400 K/uL   Neutrophils Relative % 65 %   Neutro Abs 3.6 1.7 - 7.7 K/uL   Lymphocytes Relative 19 %   Lymphs Abs 1.0 0.7 - 4.0 K/uL   Monocytes Relative 11 %   Monocytes Absolute 0.6 0.1 - 1.0 K/uL   Eosinophils Relative 3 %   Eosinophils Absolute 0.2 0.0 - 0.7 K/uL   Basophils Relative 2 %   Basophils Absolute 0.1  0.0 - 0.1 K/uL  D-dimer, quantitative (not at [facility])  Result Value Ref Range   D-Dimer, Quant 2.36 (H) 0.00 - 0.50 ug/mL-FEU  Brain natriuretic peptide  Result Value Ref Range   B Natriuretic Peptide 428.2 (H) 0.0 - 100.0 pg/mL  I-Stat CG4 Lactic Acid, ED  Result Value Ref Range   Lactic Acid, Venous 0.94 0.5 - 1.9 mmol/L  POC occult blood, ED  Result Value Ref Range   Fecal Occult Bld NEGATIVE NEGATIVE   Dg Chest 2 View  Result Date: 07/27/2017 CLINICAL DATA:  Leg swelling EXAM: CHEST - 2 VIEW COMPARISON:  11/11/2016 FINDINGS: Dual lead pacer remains in place. Ill-defined density along the end of the right first rib is chronically present at least back through 2014 and accordingly is likely bony. Old healed bilateral rib fractures. Thoracic spondylosis. The lungs appear otherwise clear. No pleural effusion. No cardiomegaly. Atherosclerotic calcification of the aortic arch. IMPRESSION: 1.  No active cardiopulmonary disease is radiographically apparent. 2.  Aortoiliac atherosclerotic vascular disease. 3. Dual lead pacer noted.  Electronically Signed   By: Gaylyn Rong M.D.   On: 07/27/2017 17:29   Ct Angio Chest Pe W/cm &/or Wo Cm  Result Date: 07/27/2017 CLINICAL DATA:  Bilateral leg swelling and cough.  Positive D-dimer. EXAM: CT ANGIOGRAPHY CHEST WITH CONTRAST TECHNIQUE: Multidetector CT imaging of the chest was performed using the standard protocol during bolus administration of intravenous contrast. Multiplanar CT image reconstructions and MIPs were obtained to evaluate the vascular anatomy. CONTRAST:  ISOVUE-370 IOPAMIDOL (ISOVUE-370) INJECTION 76% COMPARISON:  10/02/2016 FINDINGS: Cardiovascular: Satisfactory opacification of the pulmonary arteries to the segmental level. No evidence of pulmonary embolism. Normal heart size. No pericardial effusion. Coronary artery and aortic calcifications. Prominent collateral vasculature is demonstrated in the left chest with suggestion of segmental stenosis of the left subclavian vein. Mediastinum/Nodes: No significant lymphadenopathy. Esophagus is decompressed. Mild distal esophageal wall thickening similar to prior study. This may indicate reflux disease or esophagitis. Neoplasm not excluded but felt less likely due to the diffuse nature. Lungs/Pleura: Perihilar ground-glass opacities likely to represent edema. Airways disease also could have this appearance. No focal consolidation. No pleural effusions. No pneumothorax. Airways are patent. Upper Abdomen: No acute abnormality. Musculoskeletal: No chest wall abnormality. No acute or significant osseous findings. Cardiac pacemaker. Review of the MIP images confirms the above findings. IMPRESSION: 1. No evidence of significant pulmonary embolus. 2. 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 3. Mild distal esophageal wall thickening similar to prior study, likely due to reflux disease or esophagitis. Neoplasm not excluded but felt less likely due to diffuse nature. 4.  Perihilar ground-glass opacities likely represent edema. Airways disease or air trapping would be a less likely consideration. Aortic Atherosclerosis (ICD10-I70.0). Electronically Signed   By: Burman Nieves M.D.   On: 07/27/2017 22:43      Antony Madura, PA-C 07/28/17 0610    Antony Madura, PA-C 07/28/17 8119    Nicanor Alcon, April, MD 07/28/17 [phone]

## 2017-07-28 NOTE — Progress Notes (Addendum)
VASCULAR LAB PRELIMINARY  PRELIMINARY  PRELIMINARY  PRELIMINARY  Bilateral lower extremity venous duplex completed.    Preliminary report:  There is no DVT or SVT noted in the bilateral lower extremities.    [facility]  Noah Anderson, RVT 07/28/2017, 8:06 AM

## 2017-07-28 NOTE — ED Provider Notes (Signed)
Received patient at signout from [facility] Humes who briefly, patient received signout from Dr. Madilyn Hookees.  Refer to provider note for full history and physical examination.  Is a 67 year old male who presents for evaluation of leg swelling.  He was in the ED overnight awaiting duplex ultrasound for rule out of DVT.  If positive, patient will likely require initiation of a NOAC.  If negative, will discharge with doxycycline for coverage of possible cellulitis.  8:30AM  No DVT on ultrasound. Will discharge with doxycycline. Patient resting comfortably in bed in no distress. He states he feels comfortable with discharge home. First dose doxycycline given in ED. Discussed strict ED return precautions. Pt verbalized understanding of and agreement with plan and is safe for discharge home at this time.     Bennye AlmFawze, Shamicka Inga A, PA-C 07/28/17 0848    Palumbo, April, MD 07/28/17 2306

## 2017-07-28 NOTE — ED Notes (Signed)
Pain assessed. PA aware of pts complaints. Pt given another sandwich, crackers, and drink. Will continue to monitor.

## 2017-11-25 DIAGNOSIS — F339 Major depressive disorder, recurrent, unspecified: Secondary | ICD-10-CM | POA: Diagnosis not present

## 2017-11-25 DIAGNOSIS — Z1379 Encounter for other screening for genetic and chromosomal anomalies: Secondary | ICD-10-CM | POA: Diagnosis not present

## 2017-11-25 DIAGNOSIS — D509 Iron deficiency anemia, unspecified: Secondary | ICD-10-CM | POA: Diagnosis not present

## 2017-11-25 DIAGNOSIS — I249 Acute ischemic heart disease, unspecified: Secondary | ICD-10-CM | POA: Diagnosis not present

## 2017-11-25 DIAGNOSIS — D529 Folate deficiency anemia, unspecified: Secondary | ICD-10-CM | POA: Diagnosis not present

## 2017-11-25 DIAGNOSIS — I10 Essential (primary) hypertension: Secondary | ICD-10-CM | POA: Diagnosis not present

## 2017-11-25 DIAGNOSIS — R05 Cough: Secondary | ICD-10-CM | POA: Diagnosis not present

## 2017-11-25 DIAGNOSIS — R0602 Shortness of breath: Secondary | ICD-10-CM | POA: Diagnosis not present

## 2017-11-25 DIAGNOSIS — F419 Anxiety disorder, unspecified: Secondary | ICD-10-CM | POA: Diagnosis not present

## 2017-11-25 DIAGNOSIS — R112 Nausea with vomiting, unspecified: Secondary | ICD-10-CM | POA: Diagnosis not present

## 2017-11-25 DIAGNOSIS — F332 Major depressive disorder, recurrent severe without psychotic features: Secondary | ICD-10-CM | POA: Diagnosis not present

## 2017-11-25 DIAGNOSIS — R51 Headache: Secondary | ICD-10-CM | POA: Diagnosis not present

## 2017-11-25 DIAGNOSIS — E039 Hypothyroidism, unspecified: Secondary | ICD-10-CM | POA: Diagnosis not present

## 2017-11-25 DIAGNOSIS — I208 Other forms of angina pectoris: Secondary | ICD-10-CM | POA: Diagnosis not present

## 2017-11-25 DIAGNOSIS — D519 Vitamin B12 deficiency anemia, unspecified: Secondary | ICD-10-CM | POA: Diagnosis not present

## 2017-11-25 DIAGNOSIS — E785 Hyperlipidemia, unspecified: Secondary | ICD-10-CM | POA: Diagnosis not present

## 2017-11-25 DIAGNOSIS — M79609 Pain in unspecified limb: Secondary | ICD-10-CM | POA: Diagnosis not present

## 2017-11-25 DIAGNOSIS — Z125 Encounter for screening for malignant neoplasm of prostate: Secondary | ICD-10-CM | POA: Diagnosis not present

## 2017-11-25 DIAGNOSIS — I240 Acute coronary thrombosis not resulting in myocardial infarction: Secondary | ICD-10-CM | POA: Diagnosis not present

## 2017-11-26 DIAGNOSIS — R0602 Shortness of breath: Secondary | ICD-10-CM | POA: Diagnosis not present

## 2017-11-26 DIAGNOSIS — I700 Atherosclerosis of aorta: Secondary | ICD-10-CM | POA: Diagnosis not present

## 2017-11-26 DIAGNOSIS — Z87891 Personal history of nicotine dependence: Secondary | ICD-10-CM | POA: Diagnosis not present

## 2017-12-09 DIAGNOSIS — F419 Anxiety disorder, unspecified: Secondary | ICD-10-CM | POA: Diagnosis not present

## 2017-12-09 DIAGNOSIS — E539 Vitamin B deficiency, unspecified: Secondary | ICD-10-CM | POA: Diagnosis not present

## 2017-12-09 DIAGNOSIS — R252 Cramp and spasm: Secondary | ICD-10-CM | POA: Diagnosis not present

## 2017-12-09 DIAGNOSIS — R21 Rash and other nonspecific skin eruption: Secondary | ICD-10-CM | POA: Diagnosis not present

## 2017-12-09 DIAGNOSIS — D509 Iron deficiency anemia, unspecified: Secondary | ICD-10-CM | POA: Diagnosis not present

## 2017-12-23 DIAGNOSIS — E039 Hypothyroidism, unspecified: Secondary | ICD-10-CM | POA: Diagnosis not present

## 2018-02-18 DIAGNOSIS — Z125 Encounter for screening for malignant neoplasm of prostate: Secondary | ICD-10-CM | POA: Diagnosis not present

## 2018-02-18 DIAGNOSIS — R310 Gross hematuria: Secondary | ICD-10-CM | POA: Diagnosis not present

## 2018-02-18 DIAGNOSIS — R109 Unspecified abdominal pain: Secondary | ICD-10-CM | POA: Diagnosis not present

## 2018-02-18 DIAGNOSIS — R1084 Generalized abdominal pain: Secondary | ICD-10-CM | POA: Diagnosis not present

## 2018-02-18 DIAGNOSIS — E039 Hypothyroidism, unspecified: Secondary | ICD-10-CM | POA: Diagnosis not present

## 2018-02-19 DIAGNOSIS — R1084 Generalized abdominal pain: Secondary | ICD-10-CM | POA: Diagnosis not present

## 2018-02-19 DIAGNOSIS — R310 Gross hematuria: Secondary | ICD-10-CM | POA: Diagnosis not present

## 2018-02-19 DIAGNOSIS — I700 Atherosclerosis of aorta: Secondary | ICD-10-CM | POA: Diagnosis not present

## 2018-02-19 DIAGNOSIS — N400 Enlarged prostate without lower urinary tract symptoms: Secondary | ICD-10-CM | POA: Diagnosis not present

## 2018-02-23 DIAGNOSIS — D509 Iron deficiency anemia, unspecified: Secondary | ICD-10-CM | POA: Diagnosis not present

## 2018-02-25 DIAGNOSIS — R109 Unspecified abdominal pain: Secondary | ICD-10-CM | POA: Diagnosis not present

## 2018-02-25 DIAGNOSIS — Z1159 Encounter for screening for other viral diseases: Secondary | ICD-10-CM | POA: Diagnosis not present

## 2018-02-25 DIAGNOSIS — R1084 Generalized abdominal pain: Secondary | ICD-10-CM | POA: Diagnosis not present

## 2018-02-25 DIAGNOSIS — R319 Hematuria, unspecified: Secondary | ICD-10-CM | POA: Diagnosis not present

## 2018-03-17 DIAGNOSIS — E039 Hypothyroidism, unspecified: Secondary | ICD-10-CM | POA: Diagnosis not present

## 2018-07-04 DIAGNOSIS — R05 Cough: Secondary | ICD-10-CM | POA: Diagnosis not present

## 2018-07-04 DIAGNOSIS — R1084 Generalized abdominal pain: Secondary | ICD-10-CM | POA: Diagnosis not present

## 2018-07-04 DIAGNOSIS — K7031 Alcoholic cirrhosis of liver with ascites: Secondary | ICD-10-CM | POA: Diagnosis not present

## 2018-07-04 DIAGNOSIS — F17210 Nicotine dependence, cigarettes, uncomplicated: Secondary | ICD-10-CM | POA: Diagnosis not present

## 2018-07-04 DIAGNOSIS — R161 Splenomegaly, not elsewhere classified: Secondary | ICD-10-CM | POA: Diagnosis not present

## 2018-07-04 DIAGNOSIS — R079 Chest pain, unspecified: Secondary | ICD-10-CM | POA: Diagnosis not present

## 2018-07-04 DIAGNOSIS — K7460 Unspecified cirrhosis of liver: Secondary | ICD-10-CM | POA: Diagnosis not present

## 2018-07-06 DIAGNOSIS — E039 Hypothyroidism, unspecified: Secondary | ICD-10-CM | POA: Diagnosis not present

## 2018-07-06 DIAGNOSIS — K7460 Unspecified cirrhosis of liver: Secondary | ICD-10-CM | POA: Diagnosis not present

## 2018-07-06 DIAGNOSIS — R001 Bradycardia, unspecified: Secondary | ICD-10-CM | POA: Diagnosis not present

## 2018-07-06 DIAGNOSIS — R062 Wheezing: Secondary | ICD-10-CM | POA: Diagnosis not present

## 2018-07-14 DIAGNOSIS — Z79899 Other long term (current) drug therapy: Secondary | ICD-10-CM | POA: Diagnosis not present

## 2018-07-14 DIAGNOSIS — R001 Bradycardia, unspecified: Secondary | ICD-10-CM | POA: Diagnosis not present

## 2018-07-14 DIAGNOSIS — K7460 Unspecified cirrhosis of liver: Secondary | ICD-10-CM | POA: Diagnosis not present

## 2018-11-19 ENCOUNTER — Other Ambulatory Visit: Payer: Self-pay

## 2019-08-12 DIAGNOSIS — J9600 Acute respiratory failure, unspecified whether with hypoxia or hypercapnia: Secondary | ICD-10-CM

## 2019-08-12 DIAGNOSIS — R4182 Altered mental status, unspecified: Secondary | ICD-10-CM

## 2019-08-12 DIAGNOSIS — I214 Non-ST elevation (NSTEMI) myocardial infarction: Secondary | ICD-10-CM

## 2019-08-12 DIAGNOSIS — A419 Sepsis, unspecified organism: Secondary | ICD-10-CM | POA: Diagnosis not present

## 2019-08-12 DIAGNOSIS — R0789 Other chest pain: Secondary | ICD-10-CM

## 2019-08-12 DIAGNOSIS — J189 Pneumonia, unspecified organism: Secondary | ICD-10-CM

## 2019-09-21 DEATH — deceased

## 2019-10-02 IMAGING — CT CT ANGIO CHEST
2 of 6 series · 18 of 36 positions shown · IV contrast (iopamidol)
Comparison: 10/02/2016

CLINICAL DATA: Bilateral leg swelling and cough.  Positive D-dimer.

EXAM:
CT ANGIOGRAPHY CHEST WITH CONTRAST
TECHNIQUE: Multidetector CT imaging of the chest was performed using the
standard protocol during bolus administration of intravenous
contrast. Multiplanar CT image reconstructions and MIPs were
obtained to evaluate the vascular anatomy.
CONTRAST:  100mL [id number] IOPAMIDOL ([id number]) INJECTION 76%

[Series 7: pe thins · axial · 0.78mm/px · z∈[+1076,+1331]mm · 17 of 287 slices shown]
[im 16/287  lung]
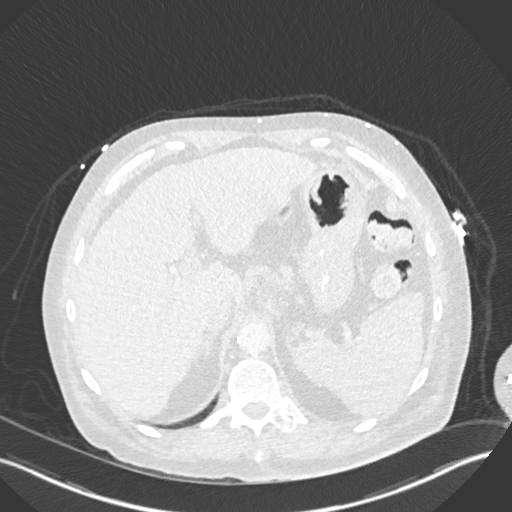
[im 32/287  mediastinal]
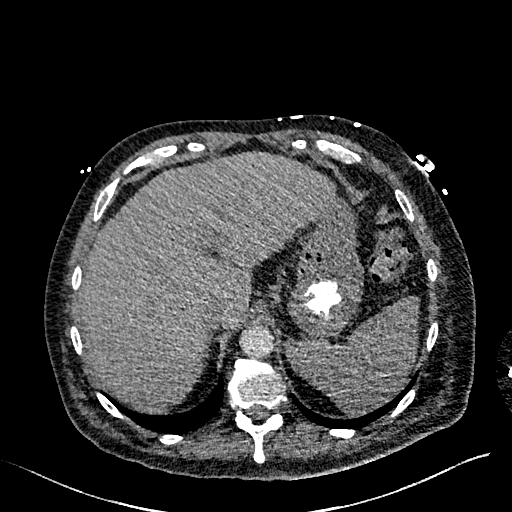
[im 48/287  lung]
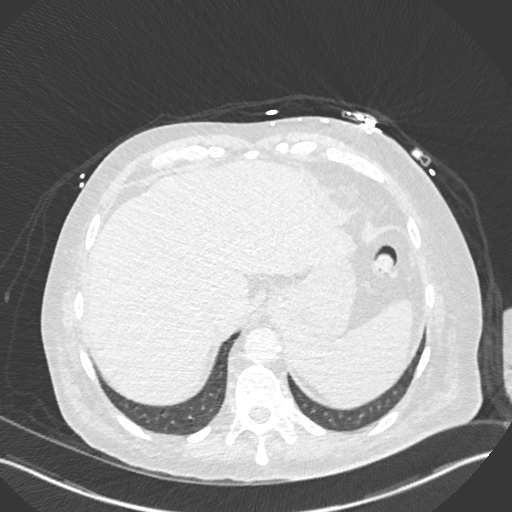
[im 64/287  mediastinal]
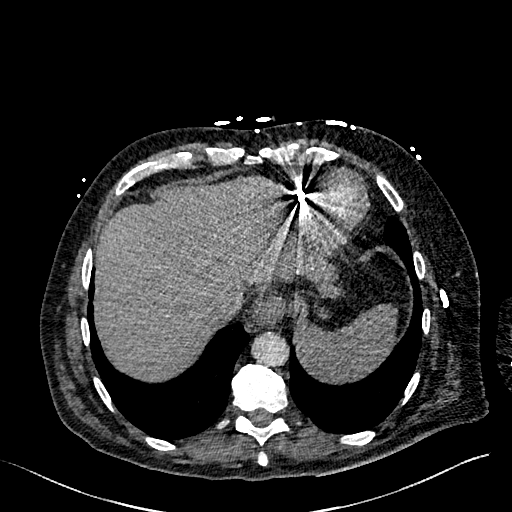
[im 80/287  lung]
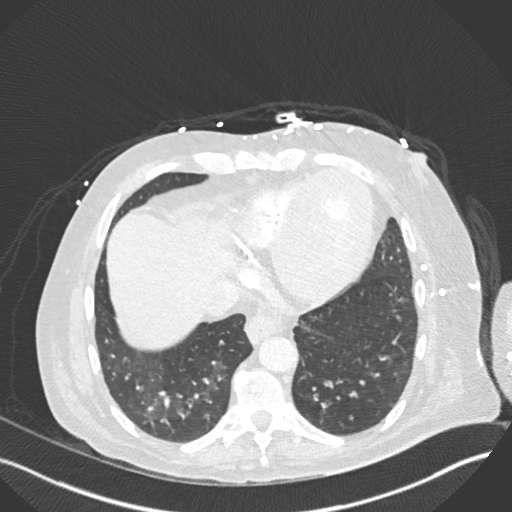
[im 96/287  mediastinal]
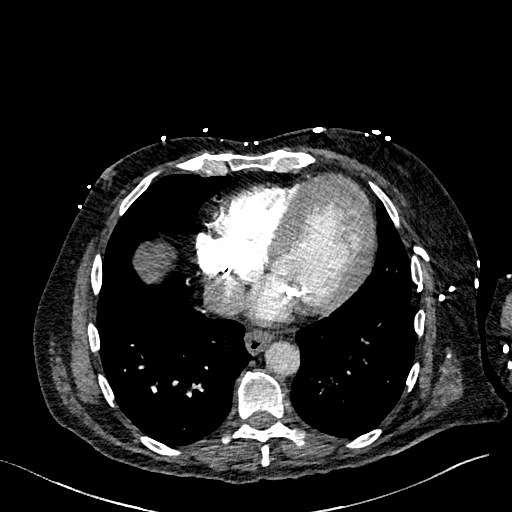
[im 112/287  lung]
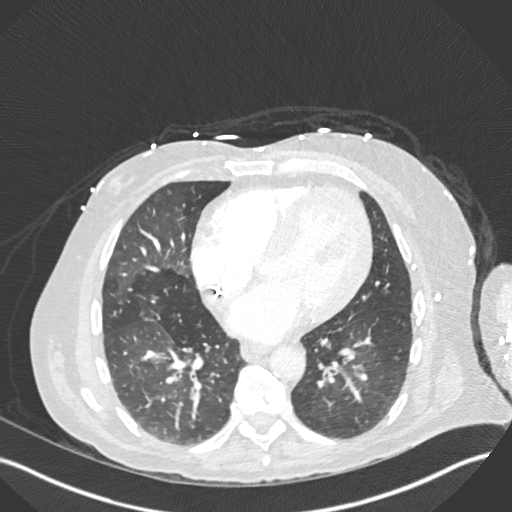
[im 128/287  mediastinal]
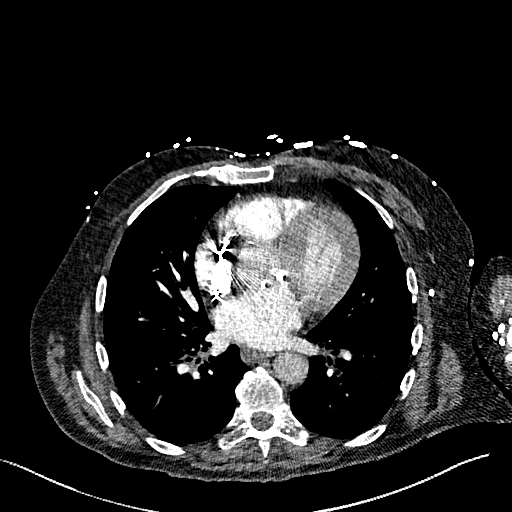
[im 144/287  lung]
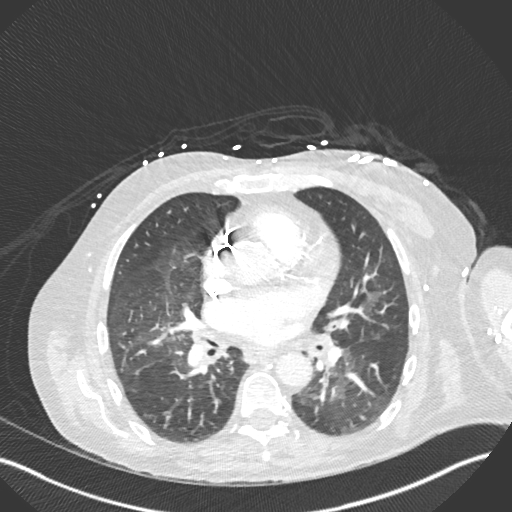
[im 159/287  mediastinal]
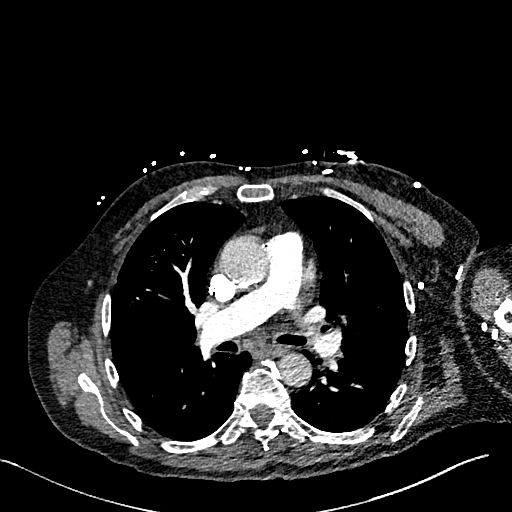
[im 175/287  lung]
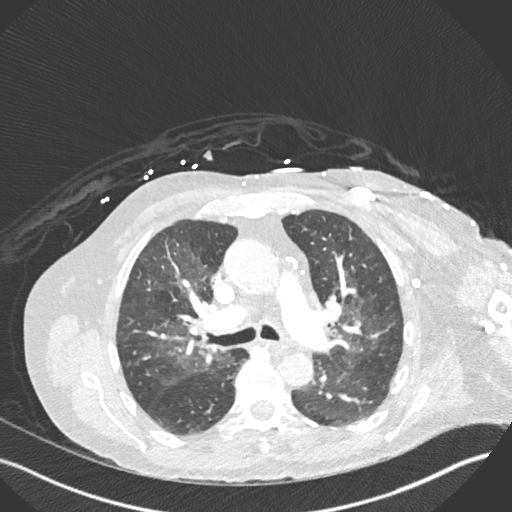
[im 191/287  mediastinal]
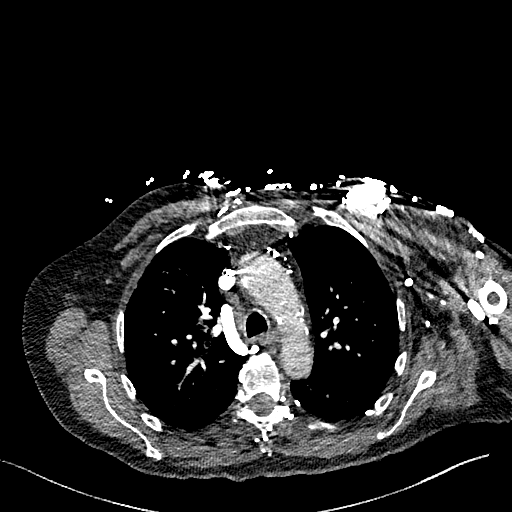
[im 207/287  lung]
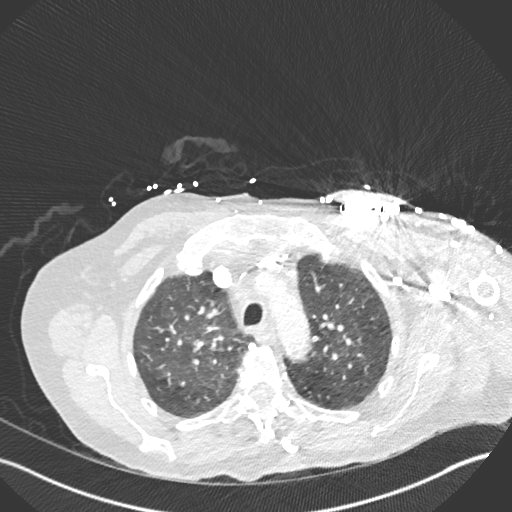
[im 223/287  mediastinal]
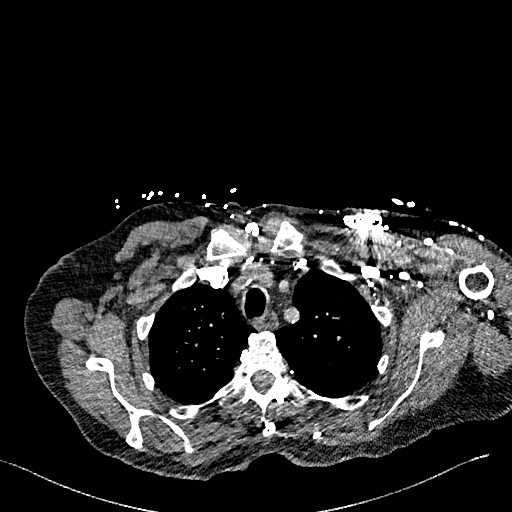
[im 239/287  lung]
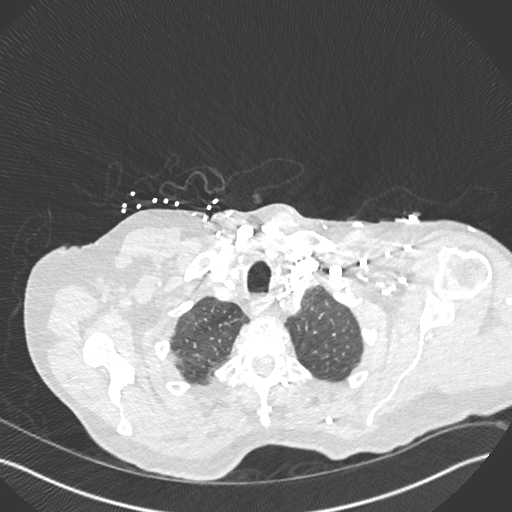
[im 255/287  mediastinal]
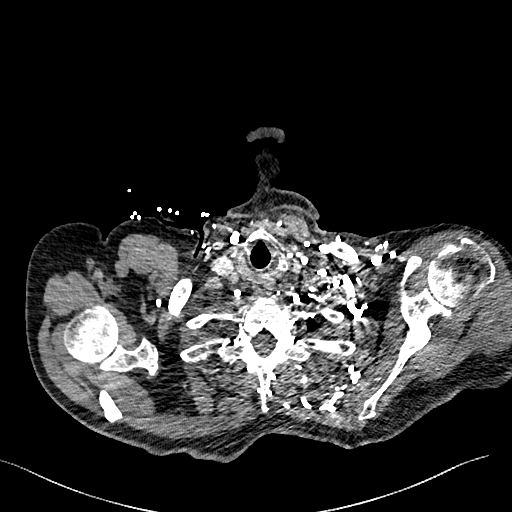
[im 271/287  lung]
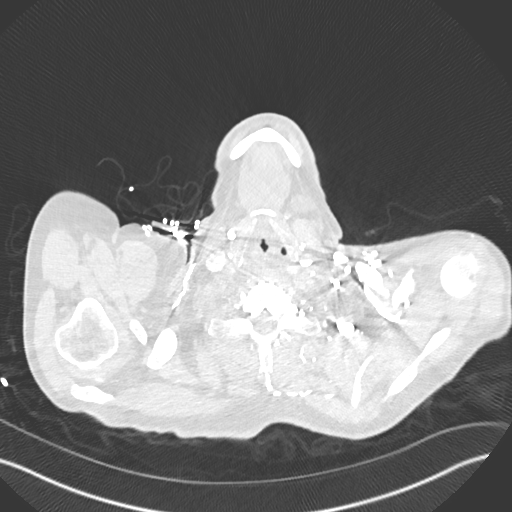

[Series 8: pe 2mm cor · coronal · 0.57mm/px · 1 of 126 slices shown]
[im 63/126  mediastinal]
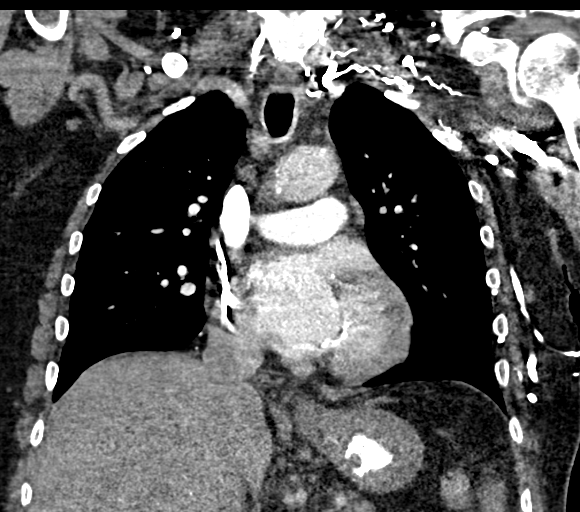

[18 of 36 positions shown; findings below may reference images not displayed]

FINDINGS: Cardiovascular: Satisfactory opacification of the pulmonary arteries
to the segmental level. No evidence of pulmonary embolism. Normal
heart size. No pericardial effusion. Coronary artery and aortic
calcifications. Prominent collateral vasculature is demonstrated in
the left chest with suggestion of segmental stenosis of the left
subclavian vein.

Mediastinum/Nodes: No significant lymphadenopathy. Esophagus is
decompressed. Mild distal esophageal wall thickening similar to
prior study. This may indicate reflux disease or esophagitis.
Neoplasm not excluded but felt less likely due to the diffuse
nature.

Lungs/Pleura: Perihilar ground-glass opacities likely to represent
edema. Airways disease also could have this appearance. No focal
consolidation. No pleural effusions. No pneumothorax. Airways are
patent.

Upper Abdomen: No acute abnormality.

Musculoskeletal: No chest wall abnormality. No acute or significant
osseous findings. Cardiac pacemaker.

Review of the MIP images confirms the above findings.
IMPRESSION: 1. No evidence of significant pulmonary embolus.
2. Probable segmental stenosis of the left subclavian vein with
multiple left chest collateral vessels.
3. Mild distal esophageal wall thickening similar to prior study,
likely due to reflux disease or esophagitis. Neoplasm not excluded
but felt less likely due to diffuse nature.
4. Perihilar ground-glass opacities likely represent edema. Airways
disease or air trapping would be a less likely consideration.

Aortic Atherosclerosis (APJS9-RHI.I).
